# Patient Record
Sex: Male | Born: 1995 | Race: White | Hispanic: No | Marital: Married | Smoking: Never smoker
Health system: Southern US, Community
[De-identification: ages and names within clinical notes are randomized; demographics above are authoritative.]

## PROBLEM LIST (undated history)

## (undated) DIAGNOSIS — F329 Major depressive disorder, single episode, unspecified: Secondary | ICD-10-CM

## (undated) DIAGNOSIS — F32A Depression, unspecified: Secondary | ICD-10-CM

## (undated) DIAGNOSIS — G43909 Migraine, unspecified, not intractable, without status migrainosus: Secondary | ICD-10-CM

## (undated) HISTORY — DX: Depression, unspecified: F32.A

## (undated) HISTORY — DX: Major depressive disorder, single episode, unspecified: F32.9

## (undated) HISTORY — DX: Migraine, unspecified, not intractable, without status migrainosus: G43.909

---
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
## 2014-05-09 ENCOUNTER — Encounter: Payer: Self-pay | Admitting: Family Medicine

## 2014-05-09 ENCOUNTER — Ambulatory Visit (INDEPENDENT_AMBULATORY_CARE_PROVIDER_SITE_OTHER): Payer: BC Managed Care – PPO | Admitting: Family Medicine

## 2014-05-09 VITALS — BP 118/68 | HR 64 | Temp 98.9°F | Resp 12 | Ht 73.0 in | Wt 203.0 lb

## 2014-05-09 DIAGNOSIS — G43909 Migraine, unspecified, not intractable, without status migrainosus: Secondary | ICD-10-CM | POA: Insufficient documentation

## 2014-05-09 DIAGNOSIS — G43809 Other migraine, not intractable, without status migrainosus: Secondary | ICD-10-CM

## 2014-05-09 DIAGNOSIS — G43901 Migraine, unspecified, not intractable, with status migrainosus: Secondary | ICD-10-CM

## 2014-05-09 MED ORDER — PREDNISONE 20 MG PO TABS: ORAL_TABLET | ORAL | Status: DC

## 2014-05-09 MED ORDER — PROMETHAZINE HCL 12.5 MG PO TABS: 12.5000 mg | ORAL_TABLET | Freq: Four times a day (QID) | ORAL | Status: DC | PRN

## 2014-05-09 NOTE — Progress Notes (Signed)
   Subjective:    Patient ID: Jacob Kidd, male    DOB: 1996/01/31, 18 y.o.   MRN: [id number]  HPI Patient has a past medical history of migraines. He has seen neurology in the past. He has never been started on a daily preventative medication. 3 days ago he developed a diffuse headache. It is nonpulsatile in nature. He has associated photophobia and phonophobia.  He also has nausea. It feels like his typical migraine. He is tried ibuprofen with no relief. He denies any neurologic deficits. He is currently sitting in a dark room wearing sunglasses. Past Medical History  Diagnosis Date  . Migraines    No current outpatient prescriptions on file prior to visit.   No current facility-administered medications on file prior to visit.   No Known Allergies History   Social History  . Marital Status: Single    Spouse Name: N/A    Number of Children: N/A  . Years of Education: N/A   Occupational History  . Not on file.   Social History Main Topics  . Smoking status: Never Smoker   . Smokeless tobacco: Current User  . Alcohol Use: No  . Drug Use: No  . Sexual Activity: Not on file   Other Topics Concern  . Not on file   Social History Narrative  . No narrative on file      Review of Systems  All other systems reviewed and are negative.      Objective:   Physical Exam  Vitals reviewed. Constitutional: He is oriented to person, place, and time. He appears well-developed and well-nourished. No distress.  Eyes: Conjunctivae and EOM are normal. Pupils are equal, round, and reactive to light.  Cardiovascular: Normal rate, regular rhythm and normal heart sounds.   Pulmonary/Chest: Effort normal and breath sounds normal.  Neurological: He is alert and oriented to person, place, and time. He has normal reflexes. He displays normal reflexes. No cranial nerve deficit. He exhibits normal muscle tone. Coordination normal.  Skin: He is not diaphoretic.          Assessment &  Plan:  1. Status migrainosus Begin prednisone taper pack for status migrainosus. Also use Phenergan 12.5 mg by mouth every 6 hours when necessary headache or nausea. After resolution of this episode, consider beginning propranolol or Topamax as a daily preventative. - predniSONE (DELTASONE) 20 MG tablet; 3 tabs poqday 1-2, 2 tabs poqday 3-4, 1 tab poqday 5-6  Dispense: 12 tablet; Refill: 0 - promethazine (PHENERGAN) 12.5 MG tablet; Take 1 tablet (12.5 mg total) by mouth every 6 (six) hours as needed for nausea or vomiting.  Dispense: 30 tablet; Refill: 0

## 2015-01-16 ENCOUNTER — Ambulatory Visit (INDEPENDENT_AMBULATORY_CARE_PROVIDER_SITE_OTHER): Payer: 59 | Admitting: Physician Assistant

## 2015-01-16 ENCOUNTER — Encounter: Payer: Self-pay | Admitting: Physician Assistant

## 2015-01-16 ENCOUNTER — Telehealth: Payer: Self-pay | Admitting: Family Medicine

## 2015-01-16 ENCOUNTER — Ambulatory Visit
Admission: RE | Admit: 2015-01-16 | Discharge: 2015-01-16 | Disposition: A | Discharge status: Home / Self Care | Payer: 59 | Source: Ambulatory Visit | Attending: Physician Assistant | Admitting: Physician Assistant

## 2015-01-16 VITALS — BP 126/84 | HR 84 | Temp 98.9°F | Resp 18 | Wt 207.0 lb

## 2015-01-16 DIAGNOSIS — R202 Paresthesia of skin: Secondary | ICD-10-CM

## 2015-01-16 DIAGNOSIS — R29898 Other symptoms and signs involving the musculoskeletal system: Secondary | ICD-10-CM

## 2015-01-16 DIAGNOSIS — M542 Cervicalgia: Secondary | ICD-10-CM
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
## 2015-01-16 NOTE — Telephone Encounter (Signed)
Pt made aware of normal basic neck films.. Order for MRI placed

## 2015-01-16 NOTE — Telephone Encounter (Signed)
-----   Message from Dorena BodoMary B Dixon, PA-C sent at 01/16/2015 12:35 PM EST ----- Tell patient that x-ray normal. Tell him that we will schedule him for further test once we get preapproved with his insurance and get it scheduled. He has been having numbness tingling down his right arm for 2 years. Weakness in the arm.  Needs MRI cervical spine. Place order for MRI cervical spine.

## 2015-01-17 ENCOUNTER — Telehealth: Payer: Self-pay | Admitting: Family Medicine

## 2015-01-17 NOTE — Telephone Encounter (Signed)
Patients wife calling to say that his arm is worse today  Please call    [phone]

## 2015-01-17 NOTE — Telephone Encounter (Signed)
Pt called back.  Told MRI denied by insurance.  We have ordered ortho referral to assess further.

## 2015-01-20 ENCOUNTER — Emergency Department (HOSPITAL_COMMUNITY): Payer: 59

## 2015-01-20 ENCOUNTER — Emergency Department (HOSPITAL_COMMUNITY)
Admission: EM | Admit: 2015-01-20 | Discharge: 2015-01-20 | Disposition: A | Discharge status: Home / Self Care | Payer: 59 | Attending: Emergency Medicine | Admitting: Emergency Medicine

## 2015-01-20 ENCOUNTER — Encounter (HOSPITAL_COMMUNITY): Payer: Self-pay | Admitting: Emergency Medicine

## 2015-01-20 ENCOUNTER — Other Ambulatory Visit: Payer: Self-pay | Admitting: Family Medicine

## 2015-01-20 DIAGNOSIS — R202 Paresthesia of skin: Secondary | ICD-10-CM

## 2015-01-20 DIAGNOSIS — G43909 Migraine, unspecified, not intractable, without status migrainosus: Secondary | ICD-10-CM | POA: Insufficient documentation

## 2015-01-20 DIAGNOSIS — M79601 Pain in right arm: Secondary | ICD-10-CM | POA: Diagnosis present

## 2015-01-20 DIAGNOSIS — M75101 Unspecified rotator cuff tear or rupture of right shoulder, not specified as traumatic: Secondary | ICD-10-CM | POA: Diagnosis not present

## 2015-01-20 DIAGNOSIS — R200 Anesthesia of skin: Secondary | ICD-10-CM | POA: Diagnosis not present

## 2015-01-20 DIAGNOSIS — Z79899 Other long term (current) drug therapy: Secondary | ICD-10-CM | POA: Insufficient documentation

## 2015-01-20 DIAGNOSIS — M542 Cervicalgia: Secondary | ICD-10-CM

## 2015-01-20 DIAGNOSIS — Z791 Long term (current) use of non-steroidal anti-inflammatories (NSAID): Secondary | ICD-10-CM | POA: Insufficient documentation

## 2015-01-20 DIAGNOSIS — R29898 Other symptoms and signs involving the musculoskeletal system: Secondary | ICD-10-CM

## 2015-01-20 MED ORDER — DICLOFENAC SODIUM 50 MG PO TBEC
50.0000 mg | DELAYED_RELEASE_TABLET | Freq: Two times a day (BID) | ORAL | Status: DC
Start: 2015-01-20 — End: 2015-09-02

## 2015-01-20 MED ORDER — HYDROCODONE-ACETAMINOPHEN 5-325 MG PO TABS
1.0000 | ORAL_TABLET | Freq: Once | ORAL | Status: AC
  Administered 2015-01-20: 1 via ORAL

## 2015-01-20 MED ORDER — OXYCODONE-ACETAMINOPHEN 5-325 MG PO TABS
1.0000 | ORAL_TABLET | Freq: Once | ORAL | Status: DC
Start: 2015-01-20 — End: 2015-01-20

## 2015-01-20 MED ORDER — HYDROCODONE-ACETAMINOPHEN 5-325 MG PO TABS
ORAL_TABLET | ORAL | Status: AC
  Administered 2015-01-20: 1 via ORAL
  Filled 2015-01-20: qty 1

## 2015-01-20 MED ORDER — HYDROCODONE-ACETAMINOPHEN 5-325 MG PO TABS: 1.0000 | ORAL_TABLET | ORAL | Status: DC | PRN

## 2015-01-20 NOTE — ED Provider Notes (Signed)
CSN: [id number]     Arrival date & time 01/20/15  1414 History  This chart was scribed for Ivery QualeHobson Bryant, PA-C with Vanetta MuldersScott Zackowski, MD by Tonye RoyaltyJoshua Chen, ED Scribe. This patient was seen in room APFT22/APFT22 and the patient's care was started at 3:14 PM.    Chief Complaint  Patient presents with  . Arm Pain   Patient is a 19 y.o. male presenting with arm pain. The history is provided by the patient. No language interpreter was used.  Arm Pain This is a chronic problem. Episode onset: 2 years ago, worse past few days. The problem occurs daily. The problem has been gradually worsening. Pertinent negatives include no chest pain. The symptoms are aggravated by exertion (movement). Nothing relieves the symptoms. He has tried nothing for the symptoms.    HPI Comments: Jacob Kidd is a 19 y.o. male who presents to the Emergency Department complaining of right arm pain and numbness, worse in the past few days. Per records, he has been dealing with intermittent numbness and tingling to his right arm for the past few years. He had an x-ray of his c-spine at his PCP 4 days ago that was normal. He states he was scheduled to see orthopedist and get MRI, but this morning was told the plan was changed and he is instead to see physical therapy, though he states he does not have appointment scheduled yet. He states his arm has been numb and tingling all day today, has had a burning sensation to his shoulder and right neck, and is not able to hold his arm above his head for long. His work involved exertion using his hands and states he is unable to turn a wrench or do other things without pain.   Past Medical History  Diagnosis Date  . Migraines    History reviewed. No pertinent past surgical history. History reviewed. No pertinent family history. History  Substance Use Topics  . Smoking status: Never Smoker   . Smokeless tobacco: Current User  . Alcohol Use: No    Review of Systems  Cardiovascular:  Negative for chest pain.  Musculoskeletal:       Right arm pain  Neurological: Positive for weakness and numbness.  All other systems reviewed and are negative.     Allergies  Review of patient's allergies indicates no known allergies.  Home Medications   Prior to Admission medications   Medication Sig Start Date End Date Taking? Authorizing Provider  cyclobenzaprine (FLEXERIL) 10 MG tablet Take 10 mg by mouth 3 (three) times daily as needed for muscle spasms.   Yes Historical Provider, MD  diclofenac (VOLTAREN) 50 MG EC tablet Take 1 tablet (50 mg total) by mouth 2 (two) times daily. 01/20/15   Hope Orlene OchM Neese, NP  HYDROcodone-acetaminophen (NORCO/VICODIN) 5-325 MG per tablet Take 1 tablet by mouth every 4 (four) hours as needed. 01/20/15   Hope Orlene OchM Neese, NP   BP 115/79 mmHg  Pulse 114  Temp(Src) 99 F (37.2 C) (Oral)  Resp 18  Ht 6\' 1"  (1.854 m)  Wt 208 lb (94.348 kg)  BMI 27.45 kg/m2  SpO2 100% Physical Exam  Constitutional: He is oriented to person, place, and time. He appears well-developed and well-nourished.  HENT:  Head: Normocephalic and atraumatic.  Eyes: Conjunctivae are normal.  Neck: Normal range of motion. Neck supple.  Cardiovascular: Normal rate, regular rhythm and normal heart sounds.   No murmur heard. Pulses:      Radial pulses are 2+ on  the right side, and 2+ on the left side.  Pulmonary/Chest: Effort normal and breath sounds normal. No respiratory distress. He has no wheezes. He has no rales.  Musculoskeletal: Normal range of motion.  No atrophy of thenar eminence on right Capillary refill <2 seconds No defect of palmar arch on right Full ROM of right elbow, wrist, and fingers No deformity or tenderness of forearm brachial pulse is 2+ tightness and moderate tenderness of upper trapezius on right pain to palpation to the anterior shoulder on right  Pt noted to have weakness with testing of the Supraspinatus and Infraspinatus on the right. Soreness with  ROM of right shoulder No deformity, no dislocation  Neurological: He is alert and oriented to person, place, and time.  Grip is symmetrical   Skin: Skin is warm and dry.  Psychiatric: He has a normal mood and affect.  Nursing note and vitals reviewed.   ED Course  Procedures (including critical care time)  DIAGNOSTIC STUDIES: Oxygen Saturation is 100% on room air, normal by my interpretation.    COORDINATION OF CARE: 3:28 PM Discussed treatment plan with patient at beside, including consult with radiology. The patient agrees with the plan and has no further questions at this time.   Labs Review Labs Reviewed - No data to display  Imaging Review Dg Eye Foreign Body  01/20/2015   CLINICAL DATA:  Metal working/exposure; clearance prior to MRI  EXAM: ORBITS FOR FOREIGN BODY - 2 VIEW  COMPARISON:  None  FINDINGS: No orbital metallic foreign bodies.  Bones and sinuses unremarkable.  IMPRESSION: No orbital metallic foreign bodies.   Electronically Signed   By: Ulyses Southward M.D.   On: 01/20/2015 17:14   Mr Cervical Spine Wo Contrast  01/20/2015   CLINICAL DATA:  Neck pain and right arm numbness for 3 years, worse in the last 3 weeks.  EXAM: MRI CERVICAL SPINE WITHOUT CONTRAST  TECHNIQUE: Multiplanar, multisequence MR imaging of the cervical spine was performed. No intravenous contrast was administered.  COMPARISON:  Cervical spine radiographs 01/16/2015  FINDINGS: Images are variably degraded by motion artifact, greatest on the axial T2 spin echo images.  There is unchanged straightening of the normal cervical lordosis. There is no significant listhesis. Vertebral body heights and intervertebral disc space heights are preserved. Very mild multilevel disc desiccation is present. Vertebral bone marrow signal is within normal limits. Craniocervical junction is unremarkable. Moderate right maxillary sinus mucosal thickening is partially visualized. Cervical spinal cord is normal in caliber and signal.  Paraspinal soft tissues are unremarkable.  C2-3:  Minimal left uncovertebral spurring without stenosis.  C3-4: Right uncovertebral spurring results in mild neural foraminal stenosis. No spinal stenosis.  C4-5:  Negative.  C5-6:  Minimal disc bulging without stenosis.  C6-7:  Negative.  C7-T1:  Negative.  IMPRESSION: Mild right neural foraminal stenosis at C3-4 due to uncovertebral spurring. No spinal stenosis.   Electronically Signed   By: Sebastian Ache   On: 01/20/2015 18:40    MDM  Pt has hx of numbness of the right arm, but recently he can not raise the arm, or keep it raised for any extended period of time. The pain and numbness radiates from the neck to the right hand. MRI ordered.   Pt's care to be continued by [facility], NP - 1801pm  20 y.o. male with right shoulder pain x years but worse over the past few days. MRI reviewed by Dr. Deretha Emory and he discussed findings and plan of care with  the patient. Patient voices understanding and agrees with plan. Patient to follow up with his PCP and then PT and ortho referral. Sling applied, ice and rest. Pain medication and NSAIDS. Stable for d/c without vascular compromise.  Final diagnoses:  Rotator cuff syndrome, right    I personally performed the services described in this documentation, which was scribed in my presence. The recorded information has been reviewed and is accurate.   [facility] Orlene Och, NP 01/20/15 2200

## 2015-01-20 NOTE — ED Notes (Signed)
Dr Zackowski in to see pt.   

## 2015-01-20 NOTE — ED Provider Notes (Signed)
Medical screening examination/treatment/procedure(s) were conducted as a shared visit with non-physician practitioner(s) and myself.  I personally evaluated the patient during the encounter.   EKG Interpretation None      Patient discussed with me by [place]. I saw the patient. Patient's MRI results as listed below. Do not think they are directly related to his symptoms. Patient's symptoms seem to be more consistent with a right rotator cuff type injury. The patient's primary care doctor is referring him to physical therapy and orthopedics. Patient has had some intermittent numbness of the right hand all fingers not necessarily in a nerve root distribution. Would recommend symptomatic treatment and follow-up with orthopedics.  Patient with good range of motion of his fingers. States that all fingers are numb on the right hand but that's subjective. Patient with difficulty raising his right arm above his head which would be consistent with rotator cuff injury because there is pain when he tries to do that.   No results found for this or any previous visit. Dg Eye Foreign Body  01/20/2015   CLINICAL DATA:  Metal working/exposure; clearance prior to MRI  EXAM: ORBITS FOR FOREIGN BODY - 2 VIEW  COMPARISON:  None  FINDINGS: No orbital metallic foreign bodies.  Bones and sinuses unremarkable.  IMPRESSION: No orbital metallic foreign bodies.   Electronically Signed   By: Ulyses SouthwardMark  Boles M.D.   On: 01/20/2015 17:14   Dg Cervical Spine Complete  01/16/2015   CLINICAL DATA:  Neck pain.  EXAM: CERVICAL SPINE  4+ VIEWS  COMPARISON:  None.  FINDINGS: There is no evidence of cervical spine fracture or prevertebral soft tissue swelling. Alignment is normal. No other significant bone abnormalities are identified.  IMPRESSION: No acute abnormality.   Electronically Signed   By: Maisie Fushomas  Register   On: 01/16/2015 12:30   Mr Cervical Spine Wo Contrast  01/20/2015   CLINICAL DATA:  Neck pain and right arm numbness for 3  years, worse in the last 3 weeks.  EXAM: MRI CERVICAL SPINE WITHOUT CONTRAST  TECHNIQUE: Multiplanar, multisequence MR imaging of the cervical spine was performed. No intravenous contrast was administered.  COMPARISON:  Cervical spine radiographs 01/16/2015  FINDINGS: Images are variably degraded by motion artifact, greatest on the axial T2 spin echo images.  There is unchanged straightening of the normal cervical lordosis. There is no significant listhesis. Vertebral body heights and intervertebral disc space heights are preserved. Very mild multilevel disc desiccation is present. Vertebral bone marrow signal is within normal limits. Craniocervical junction is unremarkable. Moderate right maxillary sinus mucosal thickening is partially visualized. Cervical spinal cord is normal in caliber and signal. Paraspinal soft tissues are unremarkable.  C2-3:  Minimal left uncovertebral spurring without stenosis.  C3-4: Right uncovertebral spurring results in mild neural foraminal stenosis. No spinal stenosis.  C4-5:  Negative.  C5-6:  Minimal disc bulging without stenosis.  C6-7:  Negative.  C7-T1:  Negative.  IMPRESSION: Mild right neural foraminal stenosis at C3-4 due to uncovertebral spurring. No spinal stenosis.   Electronically Signed   By: Sebastian AcheAllen  Grady   On: 01/20/2015 18:40      Vanetta MuldersScott Omarius Grantham, MD 01/20/15 [phone]

## 2015-01-20 NOTE — ED Notes (Signed)
Pt having shaking of upper ext, Has same pain in rt arm,  Alert, skin warm.

## 2015-01-20 NOTE — ED Notes (Signed)
Pt states that he has been having intermittent numbness and tingling of his right hand for a few years now with pain on right side of neck at times.

## 2015-01-20 NOTE — ED Notes (Signed)
Rt arm pain/numbness for 2 years. Seen by MD 4 days ago and had x-ray done. Advised to have PT and  See ortho, today increase in discomfort and  Came to Ed  Good radial pulse , motion .

## 2015-01-21 ENCOUNTER — Telehealth: Payer: Self-pay | Admitting: *Deleted

## 2015-01-21 DIAGNOSIS — M75100 Unspecified rotator cuff tear or rupture of unspecified shoulder, not specified as traumatic: Secondary | ICD-10-CM

## 2015-01-21 NOTE — Telephone Encounter (Signed)
[facility] to Dr. Pati GalloJames Kramer, MD orthopedic with referral number [id number]   Type of referral: consult and treat  Start Date: 01/21/15-end date 07/22/15  Number of visits:6  Specialist address: [address] [place], [place]  Medical [facility] name[facility]  Dx code: M75.100-Unspecified rotator cuff tear or rupture of unspecified shoulder  [facility] is aware of referral number was given verbally over phone

## 2015-01-21 NOTE — Telephone Encounter (Signed)
Received a call from Pt's wife stating that he went to ED on 01/20/15 with arm pain and dx him with Rotator Cuff syndrome, she says that he can not do Physical Therapy and [facility] ED suggestion as well. I placed referral to Orthopedic to expedite his referral for dx of rotator cuff syndrome.

## 2015-01-21 NOTE — Telephone Encounter (Signed)
Agree. Approve.

## 2015-01-22 ENCOUNTER — Telehealth: Payer: Self-pay | Admitting: Family Medicine

## 2015-01-22 NOTE — Telephone Encounter (Signed)
Contacted Beth back and gave her referral number for pt to be seen today.

## 2015-01-22 NOTE — Telephone Encounter (Signed)
[facility] calling to see if compass referral done for this patient to see dr Farris Haskramer today  Please call her at [phone]

## 2015-09-02 ENCOUNTER — Encounter: Payer: Self-pay | Admitting: Family Medicine

## 2015-09-02 ENCOUNTER — Ambulatory Visit (INDEPENDENT_AMBULATORY_CARE_PROVIDER_SITE_OTHER): Payer: 59 | Admitting: Family Medicine

## 2015-09-02 VITALS — BP 122/76 | HR 86 | Temp 98.9°F | Resp 18 | Ht 73.0 in | Wt 207.0 lb

## 2015-09-02 DIAGNOSIS — F322 Major depressive disorder, single episode, severe without psychotic features: Secondary | ICD-10-CM | POA: Diagnosis not present

## 2015-09-02 MED ORDER — VENLAFAXINE HCL ER 75 MG PO CP24: 150.0000 mg | ORAL_CAPSULE | Freq: Every day | ORAL | Status: DC

## 2015-09-02 NOTE — Progress Notes (Signed)
   Subjective:    Patient ID: Jacob Kidd, male    DOB: 12-10-1995, 19 y.o.   MRN: [id number]  HPI  Patient reports severe depression. Symptoms began approximately 5 years ago when his mother passed away. He has been battling depression ever since. He reports severe depression, anhedonia, poor energy, poor concentration, insomnia. He has a difficult time sleeping because his mind ruminates over daily events. He has to make himself get out of bed in the morning to go to work. He hates to leave the house. He has excessive feelings of guilt regarding his family. He denies a suicidal plan although he has thought about suicide. He does not keep any guns in the home for this reason. He states that he would never kill himself because he has a 78-year-old son that he doesn't want to abandon. However the depression has become so serious that his wife made the appointment today to get him help. He is also scheduled to talk with his pastor later this afternoon for counseling. Past Medical History  Diagnosis Date  . Migraines   . Depression    No past surgical history on file. No current outpatient prescriptions on file prior to visit.   No current facility-administered medications on file prior to visit.   No Known Allergies Social History   Social History  . Marital Status: Married    Spouse Name: N/A  . Number of Children: N/A  . Years of Education: N/A   Occupational History  . Not on file.   Social History Main Topics  . Smoking status: Never Smoker   . Smokeless tobacco: Current User  . Alcohol Use: No  . Drug Use: No  . Sexual Activity: Not on file   Other Topics Concern  . Not on file   Social History Narrative     Review of Systems  All other systems reviewed and are negative.      Objective:   Physical Exam  Cardiovascular: Normal rate, regular rhythm and normal heart sounds.   Pulmonary/Chest: Effort normal and breath sounds normal.  Psychiatric: His speech is  normal and behavior is normal. Judgment and thought content normal. His mood appears not anxious. His affect is not angry, not blunt, not labile and not inappropriate. Thought content is not paranoid and not delusional. Cognition and memory are normal. He exhibits a depressed mood. He expresses no homicidal and no suicidal ideation. He expresses no suicidal plans and no homicidal plans.  Vitals reviewed.         Assessment & Plan:  Severe single current episode of major depressive disorder, without psychotic features (HCC) - Plan: venlafaxine XR (EFFEXOR XR) 75 MG 24 hr capsule  Begin Effexor XR 75 mg by mouth every morning. In 2 weeks increase to 150 mg by mouth every morning. Recheck here in 4 weeks or immediately if worse. I strongly encouraged the patient to seek counseling.

## 2015-09-04 ENCOUNTER — Telehealth: Payer: Self-pay | Admitting: Family Medicine

## 2015-09-04 NOTE — Telephone Encounter (Signed)
Pt's wife aware.

## 2015-09-04 NOTE — Telephone Encounter (Signed)
Usually it causes the opposite.  However, try dosing it at night and let me know if symptoms do not improve.

## 2015-09-04 NOTE — Telephone Encounter (Signed)
Patients wife calling to say that the med prescribed is making him very sleepy  [phone]

## 2015-10-03 ENCOUNTER — Ambulatory Visit: Payer: 59 | Admitting: Family Medicine

## 2015-12-12 ENCOUNTER — Encounter (HOSPITAL_COMMUNITY): Payer: Self-pay | Admitting: Emergency Medicine

## 2015-12-12 ENCOUNTER — Emergency Department (INDEPENDENT_AMBULATORY_CARE_PROVIDER_SITE_OTHER)
Admission: EM | Admit: 2015-12-12 | Discharge: 2015-12-12 | Disposition: A | Discharge status: Home / Self Care | Payer: Self-pay | Source: Home / Self Care | Attending: Family Medicine | Admitting: Family Medicine

## 2015-12-12 DIAGNOSIS — A084 Viral intestinal infection, unspecified: Secondary | ICD-10-CM

## 2015-12-12 NOTE — ED Notes (Signed)
C/o intermittent abd pain onset x2 weeks associated w/nausea, emesis, diarrhea, chills Taking imodium w/some relief.  Has not been to work x2 weeks due to sx.  A&O x4... No acute distress.

## 2015-12-12 NOTE — Discharge Instructions (Signed)
Viral Gastroenteritis Do not take any more Imodium. Make sure you drink plenty of fluids to include Pedialyte. Slowly advance diet as tolerated. For worsening, new symptoms or problems, persistent vomiting, fever, inability to have bowel movement seek medical attention promptly, may need to go to the emergency department. Viral gastroenteritis is also known as stomach flu. This condition affects the stomach and intestinal tract. It can cause sudden diarrhea and vomiting. The illness typically lasts 3 to 8 days. Most people develop an immune response that eventually gets rid of the virus. While this natural response develops, the virus can make you quite ill. CAUSES  Many different viruses can cause gastroenteritis, such as rotavirus or noroviruses. You can catch one of these viruses by consuming contaminated food or water. You may also catch a virus by sharing utensils or other personal items with an infected person or by touching a contaminated surface. SYMPTOMS  The most common symptoms are diarrhea and vomiting. These problems can cause a severe loss of body fluids (dehydration) and a body salt (electrolyte) imbalance. Other symptoms may include:  Fever.  Headache.  Fatigue.  Abdominal pain. DIAGNOSIS  Your caregiver can usually diagnose viral gastroenteritis based on your symptoms and a physical exam. A stool sample may also be taken to test for the presence of viruses or other infections. TREATMENT  This illness typically goes away on its own. Treatments are aimed at rehydration. The most serious cases of viral gastroenteritis involve vomiting so severely that you are not able to keep fluids down. In these cases, fluids must be given through an intravenous line (IV). HOME CARE INSTRUCTIONS   Drink enough fluids to keep your urine clear or pale yellow. Drink small amounts of fluids frequently and increase the amounts as tolerated.  Ask your caregiver for specific rehydration  instructions.  Avoid:  Foods high in sugar.  Alcohol.  Carbonated drinks.  Tobacco.  Juice.  Caffeine drinks.  Extremely hot or cold fluids.  Fatty, greasy foods.  Too much intake of anything at one time.  Dairy products until 24 to 48 hours after diarrhea stops.  You may consume probiotics. Probiotics are active cultures of beneficial bacteria. They may lessen the amount and number of diarrheal stools in adults. Probiotics can be found in yogurt with active cultures and in supplements.  Wash your hands well to avoid spreading the virus.  Only take over-the-counter or prescription medicines for pain, discomfort, or fever as directed by your caregiver. Do not give aspirin to children. Antidiarrheal medicines are not recommended.  Ask your caregiver if you should continue to take your regular prescribed and over-the-counter medicines.  Keep all follow-up appointments as directed by your caregiver. SEEK IMMEDIATE MEDICAL CARE IF:   You are unable to keep fluids down.  You do not urinate at least once every 6 to 8 hours.  You develop shortness of breath.  You notice blood in your stool or vomit. This may look like coffee grounds.  You have abdominal pain that increases or is concentrated in one small area (localized).  You have persistent vomiting or diarrhea.  You have a fever.  The patient is a child younger than 3 months, and he or she has a fever.  The patient is a child older than 3 months, and he or she has a fever and persistent symptoms.  The patient is a child older than 3 months, and he or she has a fever and symptoms suddenly get worse.  The patient is a  baby, and he or she has no tears when crying. MAKE SURE YOU:   Understand these instructions.  Will watch your condition.  Will get help right away if you are not doing well or get worse.   This information is not intended to replace advice given to you by your health care provider. Make sure you  discuss any questions you have with your health care provider.   Document Released: 11/15/2005 Document Revised: 02/07/2012 Document Reviewed: 09/01/2011 Elsevier Interactive Patient Education [employer]2016 [employer] Inc.

## 2015-12-12 NOTE — ED Provider Notes (Signed)
CSN: [id number]     Arrival date & time 12/12/15  1440 History   First MD Initiated Contact with Patient 12/12/15 1553     Chief Complaint  Patient presents with  . Abdominal Pain   (Consider location/radiation/quality/duration/timing/severity/associated sxs/prior Treatment) HPI Comments: 20 year old male complaining of abdominal cramping, diarrhea and vomiting for 2 weeks. He states his vomiting is better. It only occurs after attempting to eat. The first week of the diarrhea he started taking Imodium right ear 2 every 4 hours until he stopped his diarrhea. Unfortunately he now has decreased bowel movements and having abdominal cramping. Denies fever. He did have an episode of vomiting yesterday and once early this morning at 2 AM. He is able to drink fluids without vomiting.   Past Medical History  Diagnosis Date  . Migraines   . Depression    History reviewed. No pertinent past surgical history. No family history on file. Social History  Substance Use Topics  . Smoking status: Never Smoker   . Smokeless tobacco: Current User  . Alcohol Use: No    Review of Systems  Constitutional: Positive for activity change and appetite change. Negative for fever.  HENT: Negative.   Respiratory: Negative.   Cardiovascular: Negative.   Gastrointestinal: Positive for nausea, vomiting, abdominal pain and diarrhea. Negative for blood in stool.  Genitourinary: Negative.   Neurological: Negative.     Allergies  Review of patient's allergies indicates no known allergies.  Home Medications   Prior to Admission medications   Medication Sig Start Date End Date Taking? Authorizing Provider  venlafaxine XR (EFFEXOR XR) 75 MG 24 hr capsule Take 2 capsules (150 mg total) by mouth daily with breakfast. 09/02/15   Donita Brooks, MD   Meds Ordered and Administered this Visit  Medications - No data to display  BP 119/81 mmHg  Pulse 68  Temp(Src) 97.6 F (36.4 C) (Oral)  Resp 16  SpO2 98% No  data found.   Physical Exam  Constitutional: He is oriented to person, place, and time. He appears well-developed and well-nourished. No distress.  Neck: Normal range of motion. Neck supple.  Cardiovascular: Normal rate, regular rhythm and normal heart sounds.   Pulmonary/Chest: No respiratory distress. He has no wheezes. He has no rales. He exhibits no tenderness.  Abdominal: Soft. Bowel sounds are normal. He exhibits no distension and no mass. There is no rebound and no guarding.  Minor tenderness to palpation along the lower mid and right abdomen.  Musculoskeletal: He exhibits no edema.  Neurological: He is alert and oriented to person, place, and time. He exhibits normal muscle tone.  Skin: Skin is warm and dry.  Psychiatric: He has a normal mood and affect.  Nursing note and vitals reviewed.   ED Course  Procedures (including critical care time)  Labs Review Labs Reviewed - No data to display  Imaging Review No results found.   Visual Acuity Review  Right Eye Distance:   Left Eye Distance:   Bilateral Distance:    Right Eye Near:   Left Eye Near:    Bilateral Near:         MDM   1. Viral gastroenteritis    Do not take any more Imodium. Make sure you drink plenty of fluids to include Pedialyte. Slowly advance diet as tolerated. For worsening, new symptoms or problems, persistent vomiting, fever, inability to have bowel movement seek medical attention promptly, may need to go to the emergency department.    Hayden Rasmussen,  NP 12/12/15 1651

## 2016-09-14 IMAGING — CR DG ORBITS FOR FOREIGN BODY
2 series · 2 of 2 positions shown · non-contrast
Comparison: None

CLINICAL DATA: Metal working/exposure; clearance prior to MRI

EXAM:
ORBITS FOR FOREIGN BODY - 2 VIEW

[view not recorded (1 of 2)]
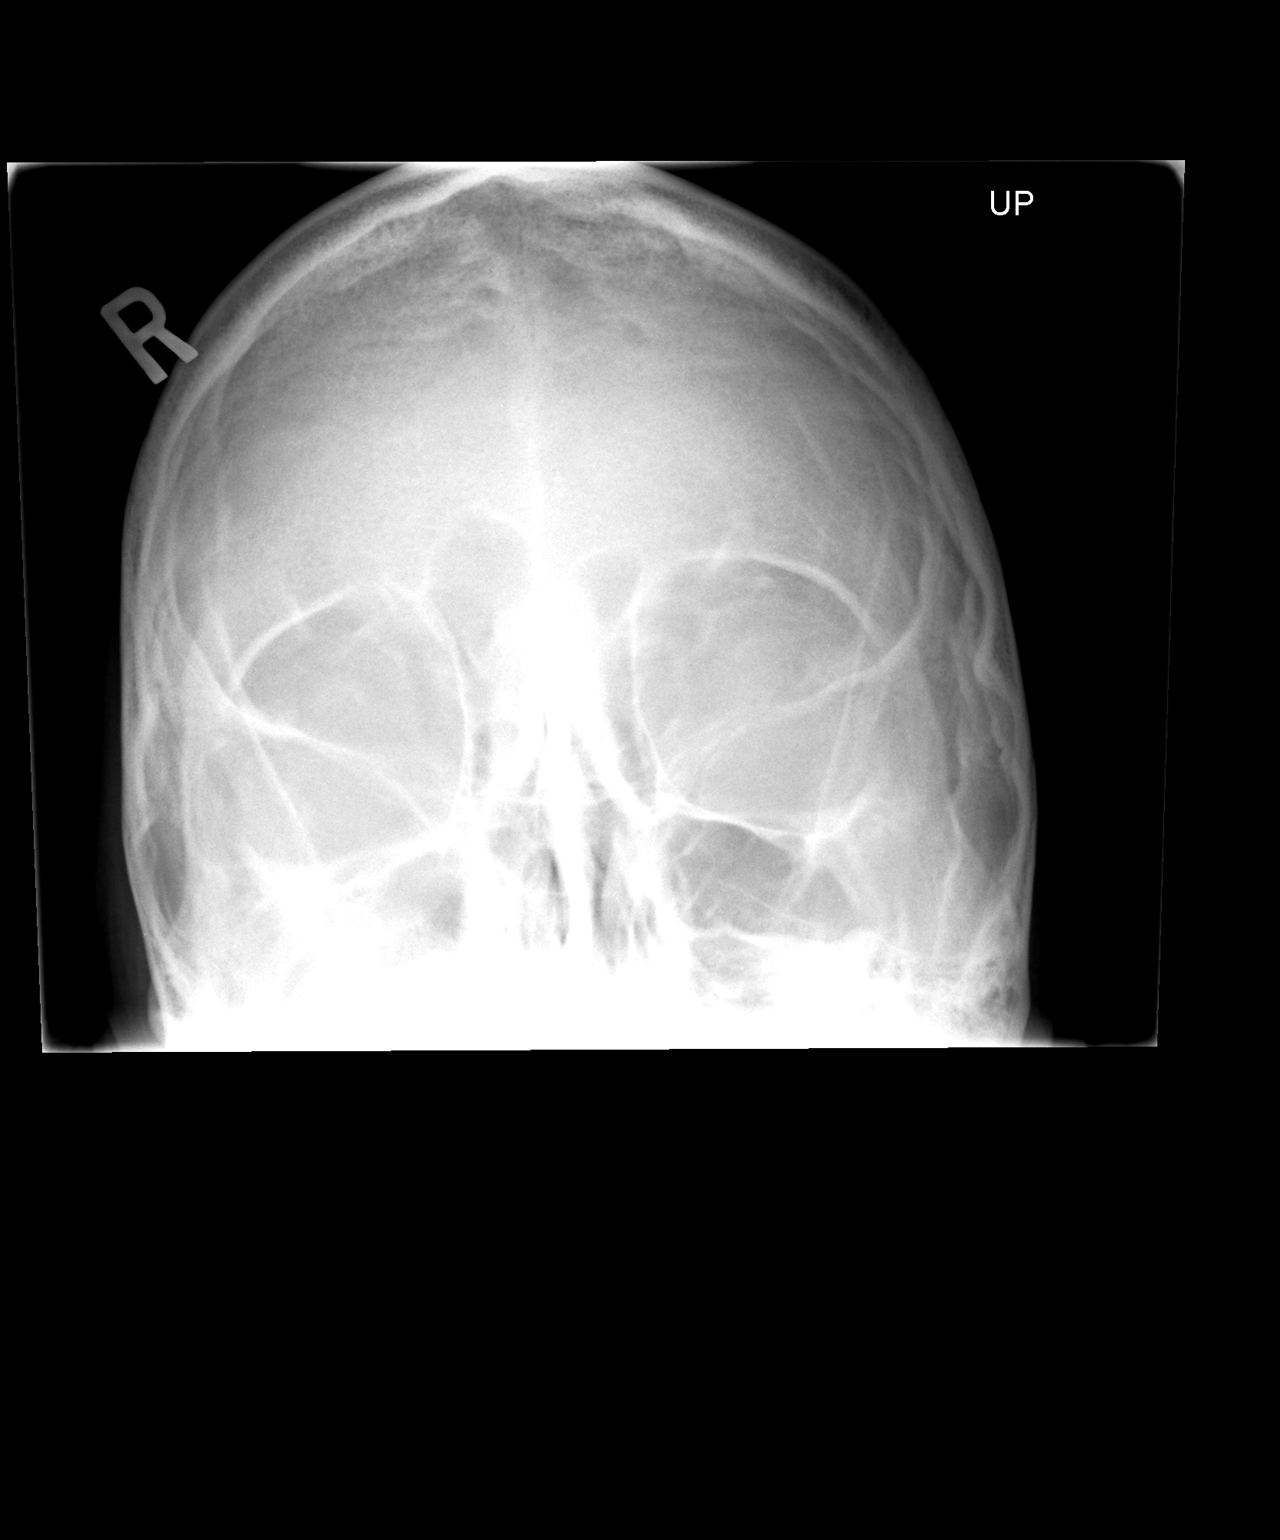

[view not recorded (2 of 2)]
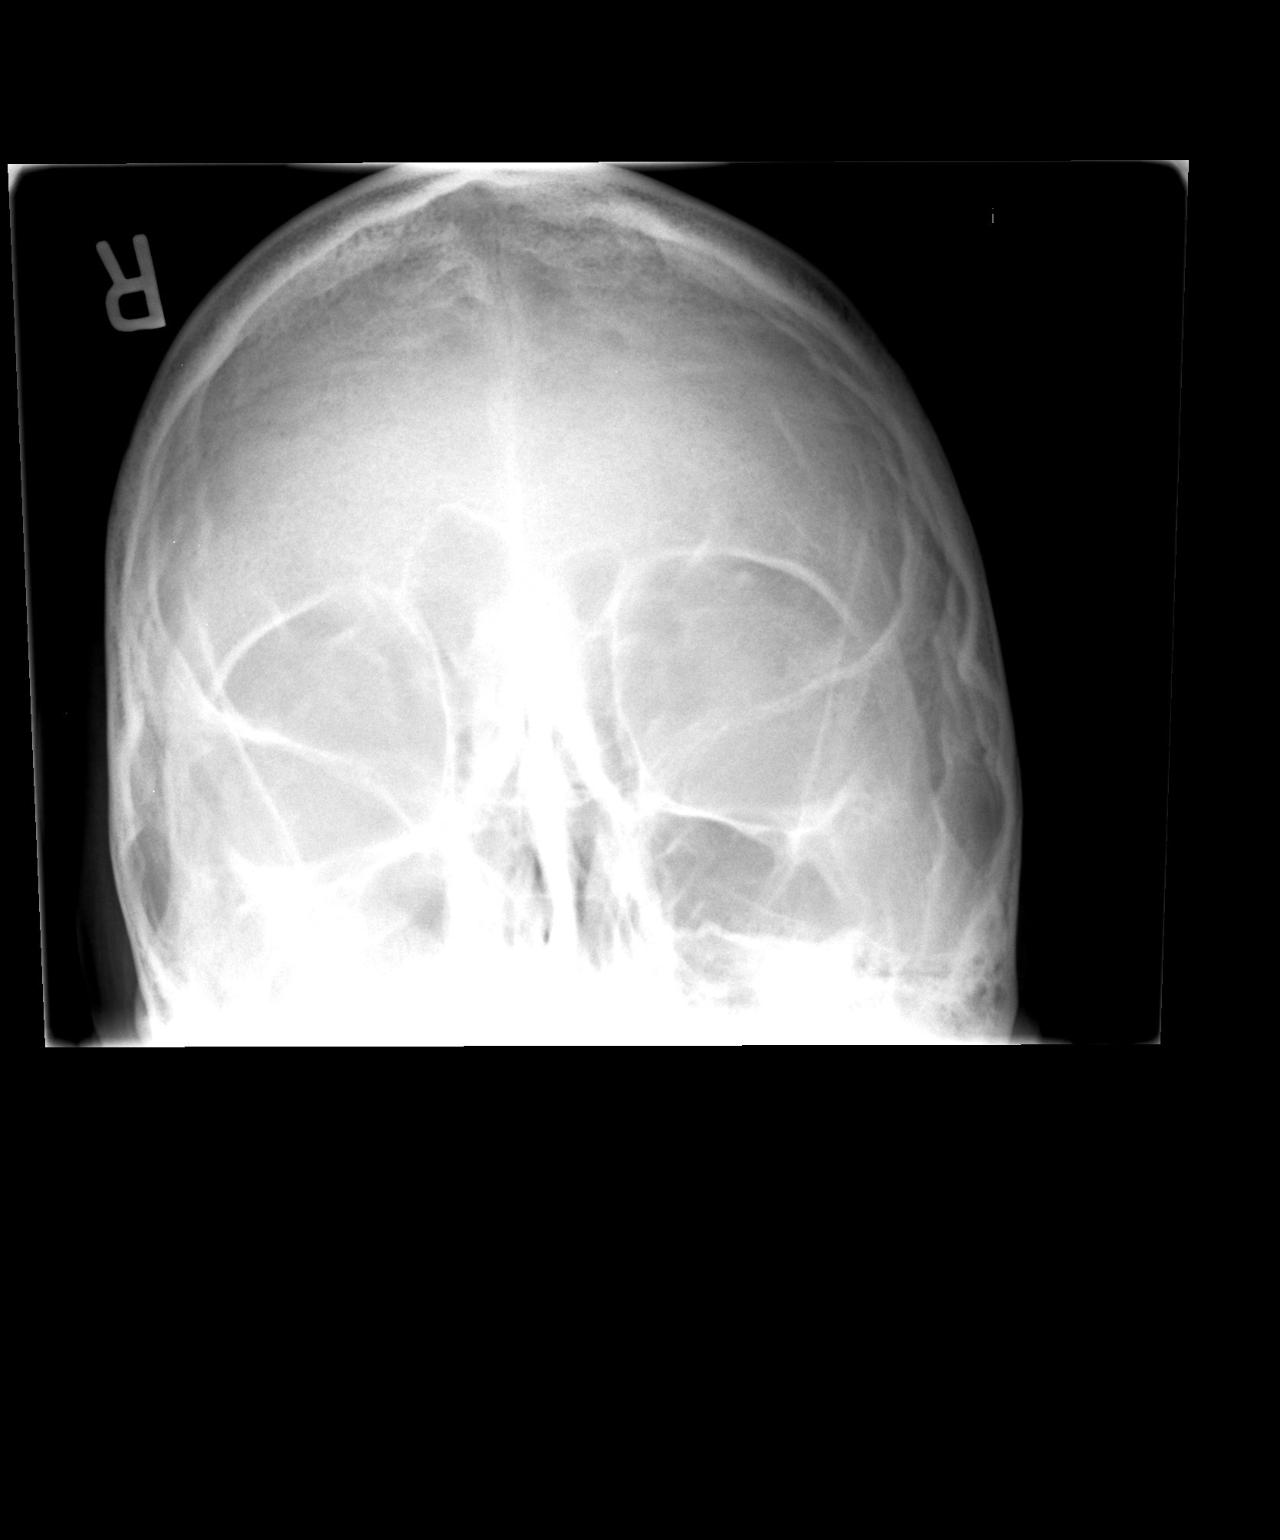

[2 of 2 positions shown; findings below may reference images not displayed]

FINDINGS: No orbital metallic foreign bodies.

Bones and sinuses unremarkable.
IMPRESSION: No orbital metallic foreign bodies.

## 2019-06-08 ENCOUNTER — Other Ambulatory Visit: Payer: Self-pay

## 2019-06-08 DIAGNOSIS — Z20822 Contact with and (suspected) exposure to covid-19: Secondary | ICD-10-CM

## 2019-06-14 LAB — NOVEL CORONAVIRUS, NAA: SARS-CoV-2, NAA: NOT DETECTED

## 2019-06-15 ENCOUNTER — Telehealth: Payer: Self-pay | Admitting: General Practice

## 2019-06-15 NOTE — Telephone Encounter (Signed)
General/Other - Results  Covid-19 Results given

## 2019-10-18 ENCOUNTER — Other Ambulatory Visit: Payer: Self-pay

## 2019-10-18 DIAGNOSIS — Z20822 Contact with and (suspected) exposure to covid-19: Secondary | ICD-10-CM

## 2019-10-20 ENCOUNTER — Telehealth: Payer: Self-pay

## 2019-10-20 NOTE — Telephone Encounter (Signed)
Pt's wife called for covid results. Advised results are not back.

## 2019-10-21 LAB — NOVEL CORONAVIRUS, NAA: SARS-CoV-2, NAA: NOT DETECTED

## 2020-03-06 ENCOUNTER — Other Ambulatory Visit: Payer: Self-pay

## 2020-03-06 ENCOUNTER — Ambulatory Visit: Payer: 59 | Admitting: Nurse Practitioner

## 2020-03-06 ENCOUNTER — Ambulatory Visit (INDEPENDENT_AMBULATORY_CARE_PROVIDER_SITE_OTHER): Payer: Self-pay | Admitting: Nurse Practitioner

## 2020-03-06 ENCOUNTER — Encounter: Payer: Self-pay | Admitting: Nurse Practitioner

## 2020-03-06 VITALS — BP 124/72 | HR 78 | Temp 97.6°F | Resp 18 | Ht 72.0 in | Wt 234.8 lb

## 2020-03-06 DIAGNOSIS — Z13228 Encounter for screening for other metabolic disorders: Secondary | ICD-10-CM

## 2020-03-06 DIAGNOSIS — R5383 Other fatigue: Secondary | ICD-10-CM

## 2020-03-06 DIAGNOSIS — Z6831 Body mass index (BMI) 31.0-31.9, adult: Secondary | ICD-10-CM

## 2020-03-06 DIAGNOSIS — L678 Other hair color and hair shaft abnormalities: Secondary | ICD-10-CM

## 2020-03-06 DIAGNOSIS — Z114 Encounter for screening for human immunodeficiency virus [HIV]: Secondary | ICD-10-CM

## 2020-03-06 DIAGNOSIS — Z1271 Encounter for screening for malignant neoplasm of testis: Secondary | ICD-10-CM

## 2020-03-06 DIAGNOSIS — Z13220 Encounter for screening for lipoid disorders: Secondary | ICD-10-CM

## 2020-03-06 DIAGNOSIS — Z23 Encounter for immunization: Secondary | ICD-10-CM

## 2020-03-06 DIAGNOSIS — Z0001 Encounter for general adult medical examination with abnormal findings: Secondary | ICD-10-CM

## 2020-03-06 DIAGNOSIS — Z7689 Persons encountering health services in other specified circumstances: Secondary | ICD-10-CM

## 2020-03-06 NOTE — Progress Notes (Signed)
New Patient Office Visit  Subjective:  Patient ID: Jacob Kidd, male    DOB: 05-31-1996  Age: 24 y.o. MRN: [id number]  CC:  Chief Complaint  Patient presents with  . Establish Care    NP    HPI Jacob Kidd is a 24 year old male presenting to clinic to establish care. He is accompanied by his step mother.  Testicular home exams monthly for testicular cancer screen Staying healthy with exercise and diet discussed with education al print out Tdap per pt is up to date Approved of HIV lab screening No cp/ct, gi,gu sxs, pain, edema, sob, or recent falls/injury. No depression or anxiety. No etoh/drug/tobacco abuse.   Past Medical History:  Diagnosis Date  . Depression   . Migraines     No past surgical history on file.  No family history on file.  Social History   Socioeconomic History  . Marital status: Married    Spouse name: Not on file  . Number of children: Not on file  . Years of education: Not on file  . Highest education level: Not on file  Occupational History  . Not on file  Tobacco Use  . Smoking status: Never Smoker  . Smokeless tobacco: Current User  Substance and Sexual Activity  . Alcohol use: No  . Drug use: No  . Sexual activity: Yes  Other Topics Concern  . Not on file  Social History Narrative  . Not on file   Social Determinants of Health   Financial Resource Strain:   . Difficulty of Paying Living Expenses:   Food Insecurity:   . Worried About Charity fundraiser in the Last Year:   . Arboriculturist in the Last Year:   Transportation Needs:   . Film/video editor (Medical):   Marland Kitchen Lack of Transportation (Non-Medical):   Physical Activity:   . Days of Exercise per Week:   . Minutes of Exercise per Session:   Stress:   . Feeling of Stress :   Social Connections:   . Frequency of Communication with Friends and Family:   . Frequency of Social Gatherings with Friends and Family:   . Attends Religious Services:   . Active  Member of Clubs or Organizations:   . Attends Archivist Meetings:   Marland Kitchen Marital Status:   Intimate Partner Violence:   . Fear of Current or Ex-Partner:   . Emotionally Abused:   Marland Kitchen Physically Abused:   . Sexually Abused:     ROS Review of Systems  All other systems reviewed and are negative.   Objective:   Today's Vitals: BP 124/72 (BP Location: Left Arm, Patient Position: Sitting, Cuff Size: Large)   Pulse 78   Temp 97.6 F (36.4 C) (Temporal)   Resp 18   Ht 6' (1.829 m)   Wt 234 lb 12.8 oz (106.5 kg)   SpO2 98%   BMI 31.84 kg/m   Physical Exam Vitals and nursing note reviewed.  Constitutional:      Appearance: Normal appearance. He is well-developed and well-groomed.  HENT:     Head: Normocephalic.     Right Ear: Hearing, tympanic membrane, ear canal and external ear normal.     Left Ear: Hearing, tympanic membrane, ear canal and external ear normal.     Nose: Nose normal.     Mouth/Throat:     Lips: Pink.     Mouth: Mucous membranes are moist.     Pharynx:  Oropharynx is clear.  Eyes:     General: Lids are normal. Lids are everted, no foreign bodies appreciated.     Extraocular Movements: Extraocular movements intact.     Conjunctiva/sclera: Conjunctivae normal.     Pupils: Pupils are equal, round, and reactive to light.  Neck:     Thyroid: No thyromegaly or thyroid tenderness.     Vascular: No carotid bruit or JVD.  Cardiovascular:     Rate and Rhythm: Normal rate and regular rhythm.     Pulses: Normal pulses.     Heart sounds: Normal heart sounds, S1 normal and S2 normal.  Pulmonary:     Effort: Pulmonary effort is normal.     Breath sounds: Normal breath sounds.  Chest:     Chest wall: No deformity.  Abdominal:     General: Abdomen is flat. Bowel sounds are normal. There is no abdominal bruit.     Palpations: Abdomen is soft. There is no hepatomegaly or splenomegaly.  Musculoskeletal:        General: Normal range of motion.     Cervical  back: Normal range of motion and neck supple.     Right lower leg: No edema.     Left lower leg: No edema.  Lymphadenopathy:     Cervical: No cervical adenopathy.  Skin:    General: Skin is warm and dry.     Capillary Refill: Capillary refill takes less than 2 seconds.  Neurological:     General: No focal deficit present.     Mental Status: He is alert and oriented to person, place, and time.  Psychiatric:        Attention and Perception: Attention normal.        Mood and Affect: Mood normal.        Speech: Speech normal.        Behavior: Behavior normal. Behavior is cooperative.        Thought Content: Thought content normal.        Cognition and Memory: Cognition normal.        Judgment: Judgment normal.     Assessment & Plan:   Problem List Items Addressed This Visit    None    Visit Diagnoses    Establishing care with new doctor, encounter for    -  Primary   Relevant Orders   COMPLETE METABOLIC PANEL WITH GFR   CBC with Differential/Platelet   Lipid panel   HIV Antibody (routine testing w rflx)   TSH   T4, Free   Screening for testicular cancer       Lipid screening       Relevant Orders   Lipid panel   Screening for metabolic disorder       Relevant Orders   COMPLETE METABOLIC PANEL WITH GFR   CBC with Differential/Platelet   Encounter for screening for HIV       Relevant Orders   HIV Antibody (routine testing w rflx)   Need for Tdap vaccination       Adult BMI 31.0-31.9 kg/sq m       Relevant Orders   COMPLETE METABOLIC PANEL WITH GFR   CBC with Differential/Platelet   Lipid panel   HIV Antibody (routine testing w rflx)   TSH   T4, Free   Brittle hair       Relevant Orders   COMPLETE METABOLIC PANEL WITH GFR   CBC with Differential/Platelet   Lipid panel   HIV Antibody (routine testing w  rflx)   TSH   T4, Free   Tiredness       Relevant Orders   COMPLETE METABOLIC PANEL WITH GFR   CBC with Differential/Platelet   Lipid panel   HIV Antibody  (routine testing w rflx)   TSH   T4, Free      Outpatient Encounter Medications as of 03/06/2020  Medication Sig  . [DISCONTINUED] venlafaxine XR (EFFEXOR XR) 75 MG 24 hr capsule Take 2 capsules (150 mg total) by mouth daily with breakfast.   No facility-administered encounter medications on file as of 03/06/2020.    Follow-up: Return if symptoms worsen or fail to improve, for and annually.   Elmore Guise, FNP

## 2020-03-07 LAB — COMPLETE METABOLIC PANEL WITH GFR
AG Ratio: 2.1 (calc) (ref 1.0–2.5)
ALT: 24 U/L (ref 9–46)
AST: 20 U/L (ref 10–40)
Albumin: 4.8 g/dL (ref 3.6–5.1)
Alkaline phosphatase (APISO): 75 U/L (ref 36–130)
BUN: 15 mg/dL (ref 7–25)
CO2: 28 mmol/L (ref 20–32)
Calcium: 9.3 mg/dL (ref 8.6–10.3)
Chloride: 104 mmol/L (ref 98–110)
Creat: 1.11 mg/dL (ref 0.60–1.35)
GFR, Est African American: 106 mL/min/{1.73_m2} (ref >=60)
GFR, Est Non African American: 91 mL/min/{1.73_m2} (ref >=60)
Globulin: 2.3 g/dL (calc) (ref 1.9–3.7)
Glucose, Bld: 88 mg/dL (ref 65–99)
Potassium: 4.3 mmol/L (ref 3.5–5.3)
Sodium: 142 mmol/L (ref 135–146)
Total Bilirubin: 0.4 mg/dL (ref 0.2–1.2)
Total Protein: 7.1 g/dL (ref 6.1–8.1)

## 2020-03-07 LAB — CBC WITH DIFFERENTIAL/PLATELET
Absolute Monocytes: 662 cells/uL (ref 200–950)
Basophils Absolute: 29 cells/uL (ref 0–200)
Basophils Relative: 0.3 %
Eosinophils Absolute: 86 cells/uL (ref 15–500)
Eosinophils Relative: 0.9 %
HCT: 48.2 % (ref 38.5–50.0)
Hemoglobin: 16.2 g/dL (ref 13.2–17.1)
Lymphs Abs: 2755 cells/uL (ref 850–3900)
MCH: 29.6 pg (ref 27.0–33.0)
MCHC: 33.6 g/dL (ref 32.0–36.0)
MCV: 88.1 fL (ref 80.0–100.0)
MPV: 10.8 fL (ref 7.5–12.5)
Monocytes Relative: 6.9 %
Neutro Abs: 6067 cells/uL (ref 1500–7800)
Neutrophils Relative %: 63.2 %
Platelets: 203 10*3/uL (ref 140–400)
RBC: 5.47 10*6/uL (ref 4.20–5.80)
RDW: 12.5 % (ref 11.0–15.0)
Total Lymphocyte: 28.7 %
WBC: 9.6 10*3/uL (ref 3.8–10.8)

## 2020-03-07 LAB — LIPID PANEL
Cholesterol: 166 mg/dL (ref <200)
HDL: 32 mg/dL — ABNORMAL LOW (ref >=40)
LDL Cholesterol (Calc): 100 mg/dL (calc) — ABNORMAL HIGH
Non-HDL Cholesterol (Calc): 134 mg/dL (calc) — ABNORMAL HIGH (ref <130)
Total CHOL/HDL Ratio: 5.2 (calc) — ABNORMAL HIGH (ref <5.0)
Triglycerides: 224 mg/dL — ABNORMAL HIGH (ref <150)

## 2020-03-07 LAB — HIV ANTIBODY (ROUTINE TESTING W REFLEX): HIV 1&2 Ab, 4th Generation: NONREACTIVE

## 2020-03-07 LAB — T4, FREE: Free T4: 1.4 ng/dL (ref 0.8–1.8)

## 2020-03-07 LAB — TSH: TSH: 1.24 mIU/L (ref 0.40–4.50)

## 2020-03-11 NOTE — Progress Notes (Signed)
Triglycerides elevated follow up lab 6 months make appt. To see me 1 wk after. Follow low fat and cholesterol diet and exercise 20 minutes 4-5 times per week.

## 2020-04-22 ENCOUNTER — Telehealth (INDEPENDENT_AMBULATORY_CARE_PROVIDER_SITE_OTHER): Payer: Self-pay | Admitting: Nurse Practitioner

## 2020-04-22 ENCOUNTER — Other Ambulatory Visit: Payer: Self-pay

## 2020-04-22 ENCOUNTER — Encounter: Payer: Self-pay | Admitting: Nurse Practitioner

## 2020-04-22 DIAGNOSIS — J988 Other specified respiratory disorders: Secondary | ICD-10-CM

## 2020-04-22 DIAGNOSIS — B9789 Other viral agents as the cause of diseases classified elsewhere: Secondary | ICD-10-CM

## 2020-04-22 NOTE — Patient Instructions (Signed)
Your symptoms are consistent with a viral respiratory illness. I recommend that you complete COVID testing and quarantine until negative results.  Your treatment plan includes resting and drinking plenty of fluids, you may take over the counter medicine for pain and fever, congestion, sore throat.  Salt water gargeles three times a day for sore throat (1 teaspoon of salt in 1 cup of warm water)

## 2020-04-22 NOTE — Progress Notes (Signed)
Virtual Visit via Video Note  I connected with Jacob Kidd on 04/22/20 at  8:15 AM EDT by a video enabled telemedicine application and verified that I am speaking with the correct person using two identifiers.   I discussed the limitations of evaluation and management by telemedicine and the availability of in person appointments. The patient expressed understanding and agreed to proceed.  History of Present Illness: Patient is a 24 year old male presenting for a video visit for symptoms of sore throat, cough, ear pressure, nasal congestion and clear drainage, chills unknown if have fever not taken, and lower back ache. Started 1 AM Sunday.  He has tried tx of tylenol cold and flu, Nyquil with minimal decrease in symptoms.  He has tried no other treatments.  Denied sick contact.  At that he Works outside the home. Has not had COVID vaccine.  He denies loss of taste, loss of smell, headache.  No GU/GI symptoms, shortness of breath, general body aches.   Past Medical History:  Diagnosis Date  . Depression   . Migraines    No past surgical history on file. No current outpatient medications on file prior to visit.   No current facility-administered medications on file prior to visit.   No Known Allergies Social History   Socioeconomic History  . Marital status: Married    Spouse name: Not on file  . Number of children: Not on file  . Years of education: Not on file  . Highest education level: Not on file  Occupational History  . Not on file  Tobacco Use  . Smoking status: Never Smoker  . Smokeless tobacco: Current User  Substance and Sexual Activity  . Alcohol use: No  . Drug use: No  . Sexual activity: Yes  Other Topics Concern  . Not on file  Social History Narrative  . Not on file   Social Determinants of Health   Financial Resource Strain:   . Difficulty of Paying Living Expenses:   Food Insecurity:   . Worried About Charity fundraiser in the Last Year:   . Arts development officer in the Last Year:   Transportation Needs:   . Film/video editor (Medical):   Marland Kitchen Lack of Transportation (Non-Medical):   Physical Activity:   . Days of Exercise per Week:   . Minutes of Exercise per Session:   Stress:   . Feeling of Stress :   Social Connections:   . Frequency of Communication with Friends and Family:   . Frequency of Social Gatherings with Friends and Family:   . Attends Religious Services:   . Active Member of Clubs or Organizations:   . Attends Archivist Meetings:   Marland Kitchen Marital Status:   Intimate Partner Violence:   . Fear of Current or Ex-Partner:   . Emotionally Abused:   Marland Kitchen Physically Abused:   . Sexually Abused:    Breast Cancer-relatedfamily history is not on file.  There is no immunization history on file for this patient.  Observations/Objective: Able to speak full sentences, no harsh cough, a/o x 3, does not appear ill or toxic, in car driving.  Assessment and Plan: Viral respiratory illness  Your symptoms are consistent with a viral respiratory illness. I recommend that you complete COVID testing and quarantine until negative results.  Your treatment plan includes resting and drinking plenty of fluids, you may take over the counter medicine for pain and fever, congestion, sore throat.  Salt water gargeles three  times a day for sore throat (1 teaspoon of salt in 1 cup of warm water)  Follow Up Instructions:    I discussed the assessment and treatment plan with the patient. The patient was provided an opportunity to ask questions and all were answered. The patient agreed with the plan and demonstrated an understanding of the instructions.   The patient was advised to call back or seek an in-person evaluation if the symptoms worsen or if the condition fails to improve as anticipated.  I provided 15 minutes of non-face-to-face time during this encounter. 8:30  Elmore Guise, FNP

## 2020-07-28 ENCOUNTER — Ambulatory Visit: Payer: Self-pay | Admitting: Nurse Practitioner

## 2020-07-30 ENCOUNTER — Other Ambulatory Visit: Payer: Self-pay

## 2020-07-30 ENCOUNTER — Ambulatory Visit: Payer: Self-pay | Admitting: Nurse Practitioner

## 2020-07-30 VITALS — BP 120/80 | HR 79 | Temp 97.6°F | Resp 18 | Wt 241.2 lb

## 2020-07-30 DIAGNOSIS — F418 Other specified anxiety disorders: Secondary | ICD-10-CM

## 2020-07-30 MED ORDER — ALPRAZOLAM 0.25 MG PO TABS: 0.2500 mg | ORAL_TABLET | Freq: Two times a day (BID) | ORAL | 0 refills | Status: DC | PRN

## 2020-07-30 MED ORDER — PAROXETINE HCL 10 MG PO TABS: 10.0000 mg | ORAL_TABLET | Freq: Every day | ORAL | 0 refills | Status: DC

## 2020-07-30 NOTE — Progress Notes (Signed)
Established Patient Office Visit  Subjective:  Patient ID: Jacob Kidd, male    DOB: 05/06/1996  Age: 24 y.o. MRN: [id number]  CC:  Chief Complaint  Patient presents with  . Anxiety    going on for a while  . Depression    HPI Jacob Kidd is a 24 year old male presenting to the clinic for sxs of depression and anxiety not feeling motivated to leave the home in two weeks. All he feels like doing is laying around all day. 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 He denied feeling suicidal or having thoughts of harm to self or others. He would like to try medications. He reports he already has an appt with psychologist Friday for counseling.  Time spent discussing action plan to record on his phone his self action planning to call 911 for help for thoughts of harm to self or others or suicidal ideation. He contracted. Also he contracted to get 20 minutes of exercise 3-4 times per week. Medication long acting taking 4 weeks to take effect and prn short acting to not be refilled discussed to be used to get to the long acting therapeutic effects as it can be habit forming and he verbalized understanding.   He denied drug, alcohol use.   Past Medical History:  Diagnosis Date  . Depression   . Migraines     No past surgical history on file.  No family history on file.  Social History   Socioeconomic History  . Marital status: Married    Spouse name: Not on file  . Number of children: Not on file  . Years of education: Not on file  . Highest education level: Not on file  Occupational History  . Not on file  Tobacco Use  . Smoking status: Never Smoker  . Smokeless tobacco: Current User  Substance and Sexual Activity  . Alcohol use: No  . Drug use: No  . Sexual activity: Yes  Other Topics Concern  . Not on file  Social History Narrative  . Not on file   Social Determinants of Health   Financial Resource Strain:   . Difficulty of Paying Living  Expenses: Not on file  Food Insecurity:   . Worried About Programme researcher, broadcasting/film/video in the Last Year: Not on file  . Ran Out of Food in the Last Year: Not on file  Transportation Needs:   . Lack of Transportation (Medical): Not on file  . Lack of Transportation (Non-Medical): Not on file  Physical Activity:   . Days of Exercise per Week: Not on file  . Minutes of Exercise per Session: Not on file  Stress:   . Feeling of Stress : Not on file  Social Connections:   . Frequency of Communication with Friends and Family: Not on file  . Frequency of Social Gatherings with Friends and Family: Not on file  . Attends Religious Services: Not on file  . Active Member of Clubs or Organizations: Not on file  . Attends Banker Meetings: Not on file  . Marital Status: Not on file  Intimate Partner Violence:   . Fear of Current or Ex-Partner: Not on file  . Emotionally Abused: Not on file  . Physically Abused: Not on file  . Sexually Abused: Not on file    No outpatient medications prior to visit.   No facility-administered medications prior to visit.    No Known Allergies  ROS Review of Systems  All other systems reviewed and are negative.     Objective:    Physical Exam Vitals and nursing note reviewed.  Constitutional:      General: He is not in acute distress.    Appearance: Normal appearance.  HENT:     Head: Normocephalic and atraumatic.  Eyes:     Extraocular Movements: Extraocular movements intact.     Pupils: Pupils are equal, round, and reactive to light.  Cardiovascular:     Rate and Rhythm: Normal rate.  Pulmonary:     Effort: Pulmonary effort is normal.  Neurological:     General: No focal deficit present.     Mental Status: He is alert and oriented to person, place, and time.  Psychiatric:        Attention and Perception: Attention and perception normal.        Mood and Affect: Mood and affect normal.        Speech: Speech normal.        Behavior:  Behavior normal. Behavior is cooperative.        Thought Content: Thought content normal.        Cognition and Memory: Cognition normal.        Judgment: Judgment normal.     BP 120/80 (BP Location: Left Arm, Patient Position: Sitting, Cuff Size: Large)   Pulse 79   Temp 97.6 F (36.4 C) (Temporal)   Resp 18   Wt 241 lb 3.2 oz (109.4 kg)   SpO2 98%   BMI 32.71 kg/m  Wt Readings from Last 3 Encounters:  07/30/20 241 lb 3.2 oz (109.4 kg)  03/06/20 234 lb 12.8 oz (106.5 kg)  09/02/15 207 lb (93.9 kg)     Health Maintenance Due  Topic Date Due  . Hepatitis C Screening  Never done  . COVID-19 Vaccine (1) Never done  . TETANUS/TDAP  Never done  . INFLUENZA VACCINE  Never done    There are no preventive care reminders to display for this patient.  Lab Results  Component Value Date   TSH 1.24 03/06/2020   Lab Results  Component Value Date   WBC 9.6 03/06/2020   HGB 16.2 03/06/2020   HCT 48.2 03/06/2020   MCV 88.1 03/06/2020   PLT 203 03/06/2020   Lab Results  Component Value Date   NA 142 03/06/2020   K 4.3 03/06/2020   CO2 28 03/06/2020   GLUCOSE 88 03/06/2020   BUN 15 03/06/2020   CREATININE 1.11 03/06/2020   BILITOT 0.4 03/06/2020   AST 20 03/06/2020   ALT 24 03/06/2020   PROT 7.1 03/06/2020   CALCIUM 9.3 03/06/2020   Lab Results  Component Value Date   CHOL 166 03/06/2020   Lab Results  Component Value Date   HDL 32 (L) 03/06/2020   Lab Results  Component Value Date   LDLCALC 100 (H) 03/06/2020   Lab Results  Component Value Date   TRIG 224 (H) 03/06/2020   Lab Results  Component Value Date   CHOLHDL 5.2 (H) 03/06/2020   No results found for: HGBA1C    Assessment & Plan:   Problem List Items Addressed This Visit    None    Visit Diagnoses    Depression with anxiety    -  Primary   Relevant Medications   PARoxetine (PAXIL) 10 MG tablet   ALPRAZolam (XANAX) 0.25 MG tablet      Meds ordered this encounter  Medications  .  PARoxetine (PAXIL) 10 MG tablet    Sig: Take 1 tablet (10 mg total) by mouth daily.    Dispense:  30 tablet    Refill:  0  . ALPRAZolam (XANAX) 0.25 MG tablet    Sig: Take 1 tablet (0.25 mg total) by mouth 2 (two) times daily as needed for anxiety.    Dispense:  20 tablet    Refill:  0    Follow-up: Return in about 4 weeks (around 08/27/2020) for with [facility] or .    Elmore Guise, FNP

## 2023-12-07 ENCOUNTER — Encounter (HOSPITAL_BASED_OUTPATIENT_CLINIC_OR_DEPARTMENT_OTHER): Payer: Self-pay | Admitting: Family Medicine

## 2023-12-07 ENCOUNTER — Ambulatory Visit (INDEPENDENT_AMBULATORY_CARE_PROVIDER_SITE_OTHER): Payer: Self-pay | Admitting: Family Medicine

## 2023-12-07 VITALS — BP 130/88 | HR 78 | Ht 72.0 in | Wt 255.0 lb

## 2023-12-07 DIAGNOSIS — G43009 Migraine without aura, not intractable, without status migrainosus: Secondary | ICD-10-CM

## 2023-12-07 MED ORDER — SUMATRIPTAN SUCCINATE 50 MG PO TABS: 50.0000 mg | ORAL_TABLET | ORAL | 0 refills | Status: DC | PRN

## 2023-12-07 NOTE — Progress Notes (Signed)
 New Patient Office Visit  Subjective:   Jacob Kidd 06/26/1996 12/07/2023  Chief Complaint  Patient presents with   New Patient (Initial Visit)    Patient is here to get established with the practice. States that he has been having headaches more frequently and states he has even had a headache that has lasted 4 days now.    HPI: XAYNE BRUMBAUGH presents today to establish care at [facility]. Introduced to publishing rights manager role and practice setting.  All questions answered.   Last PCP: Camelia Ricker, [facility] Last annual physical: Several years ago-has not seen provider in approximately 3 years per chart review. Concerns: See below   HEADACHE: Onset: Lifelong. Pt presenting today due to concern of recurring headaches and current headache lasting for 4 days.  Pt reports hx of headaches and migraines in the past. Reports migraines in childhood. Does not take any medications , not sure if prescribed med for migraine in the past.  Patient denies history of hypertension.  No elevated blood pressure readings present upon chart review in the past. Location of headache: Diffuse  Duration: chronic Quality: Feels like someone hitting me with a hammer  Severity: moderate Frequency: constant Radiation: yes Time of day headache occurs: N/a  Alleviating factors: None Aggravating factors: Exertional activities Treatments attempted: Treatments attempted: Advil, Tylenol , Excedrin     Aura: no Nausea:  no Vomiting: no Photophobia:  no Phonophobia:  no Effect on social functioning:  Yes Confusion:  no Gait disturbance/ataxia:  no Behavioral changes:  no Fevers:  no  Headache status at time of visit: current headache   The following portions of the patient's history were reviewed and updated as appropriate: past medical history, past surgical history, family history, social history, allergies,  medications, and problem list.   Patient Active Problem List   Diagnosis Date Noted   Migraines    Past Medical History:  Diagnosis Date   Depression    Migraines    History reviewed. No pertinent surgical history. Family History  Problem Relation Age of Onset   Migraines Mother    Diabetes Paternal Grandmother    Diabetes Paternal Grandfather    Social History   Socioeconomic History   Marital status: Married    Spouse name: Not on file   Number of children: Not on file   Years of education: Not on file   Highest education level: Not on file  Occupational History   Not on file  Tobacco Use   Smoking status: Never   Smokeless tobacco: Current  Vaping Use   Vaping status: Never Used  Substance and Sexual Activity   Alcohol use: No   Drug use: No   Sexual activity: Yes  Other Topics Concern   Not on file  Social History Narrative   Not on file   Social Drivers of Health   Financial Resource Strain: Low Risk  (12/07/2023)   Overall Financial Resource Strain (CARDIA)    Difficulty of Paying Living Expenses: Not hard at all  Food Insecurity: No Food Insecurity (12/07/2023)   Hunger Vital Sign    Worried About Running Out of Food in the Last Year: Never true    Ran Out of Food in the Last Year: Never true  Transportation Needs: No Transportation Needs (12/07/2023)   PRAPARE - Administrator, Civil Service (Medical): No    Lack of Transportation (Non-Medical): No  Physical Activity:  Inactive (12/07/2023)   Exercise Vital Sign    Days of Exercise per Week: 0 days    Minutes of Exercise per Session: 0 min  Stress: Stress Concern Present (12/07/2023)   [employer] of Occupational Health - Occupational Stress Questionnaire    Feeling of Stress : To some extent  Social Connections: Moderately Isolated (12/07/2023)   Social Connection and Isolation Panel [NHANES]    Frequency of Communication with Friends and Family: More than three times a week    Frequency  of Social Gatherings with Friends and Family: Never    Attends Religious Services: Never    Database Administrator or Organizations: No    Attends Banker Meetings: Never    Marital Status: Married  Catering Manager Violence: Not At Risk (12/07/2023)   Humiliation, Afraid, Rape, and Kick questionnaire    Fear of Current or Ex-Partner: No    Emotionally Abused: No    Physically Abused: No    Sexually Abused: No   Outpatient Medications Prior to Visit  Medication Sig Dispense Refill   ibuprofen (ADVIL) 200 MG tablet Take 800 mg by mouth as needed for headache.     ALPRAZolam  (XANAX ) 0.25 MG tablet Take 1 tablet (0.25 mg total) by mouth 2 (two) times daily as needed for anxiety. 20 tablet 0   PARoxetine  (PAXIL ) 10 MG tablet Take 1 tablet (10 mg total) by mouth daily. 30 tablet 0   No facility-administered medications prior to visit.   No Known Allergies  ROS: A complete ROS was performed with pertinent positives/negatives noted in the HPI. The remainder of the ROS are negative.   Objective:   Today's Vitals   12/07/23 1404 12/07/23 1433  BP: (!) 139/98 130/88  Pulse: 78   SpO2: 98%   Weight: 255 lb (115.7 kg)   Height: 6' (1.829 m)     GENERAL: Well-appearing, in NAD. Well nourished.  SKIN: Pink, warm and dry. No rash, lesion, ulceration, or ecchymoses.  Face slightly flushed. Head: Normocephalic. NECK: Trachea midline. Full ROM w/o pain or tenderness. No lymphadenopathy.  EARS: Tympanic membranes are intact, translucent without bulging and without drainage. Appropriate landmarks visualized.  EYES: Conjunctiva clear without exudates. EOMI, PERRL, no drainage present.  NOSE: Septum midline w/o deformity. Nares patent, mucosa pink and non-inflamed w/o drainage. No sinus tenderness.  THROAT: Uvula midline. Oropharynx clear.  Mucous membranes pink and moist.  RESPIRATORY: Chest wall symmetrical. Respirations even and non-labored. Breath sounds clear to auscultation  bilaterally.  CARDIAC: S1, S2 present, regular rate and rhythm without murmur or gallops. Peripheral pulses 2+ bilaterally.  MSK: Muscle tone and strength appropriate for age.  EXTREMITIES: Without clubbing, cyanosis, or edema.  NEUROLOGIC: No motor or sensory deficits. Steady, even gait. C2-C12 intact.  PSYCH/MENTAL STATUS: Alert, oriented x 3. Cooperative, appropriate mood and affect.     Assessment & Plan:  1. Migraine without aura and without status migrainosus, not intractable (Primary) Possible migraine headache versus tension headache.  Will treat with sumatriptan  50 mg every 2 hours, max 200 mg in 24 hours.  Recommend patient rest, use Tylenol  or ibuprofen as needed, increase clear fluids.  If no improvement in 24 to 48 hours, reach out to PCP.  Blood pressure did improve upon recheck, will have patient return in the next 1 to 2 weeks for physical and blood pressure recheck.  Patient to reach out to office if new, worrisome, or unresolved symptoms arise or if no improvement in patient's condition. Patient verbalized  understanding and is agreeable to treatment plan. All questions answered to patient's satisfaction.    Return in about 2 weeks (around 12/21/2023) for [facility], BP/Headache Follow up .    Thersia Schuyler Stark, [place]

## 2023-12-07 NOTE — Patient Instructions (Addendum)
 Please keep an eye on your Blood pressure and keep a log of readings.

## 2023-12-14 ENCOUNTER — Ambulatory Visit (HOSPITAL_BASED_OUTPATIENT_CLINIC_OR_DEPARTMENT_OTHER): Payer: Self-pay | Admitting: Family Medicine

## 2023-12-14 NOTE — Telephone Encounter (Signed)
 Copied from CRM [id number]. Topic: Clinical - Medication Question >> Dec 14, 2023 12:24 PM Geneva B wrote: Reason for CRM: patient is calling in because the medication he was prescribed sumatriptan  is not working for him he is still having headaches and he wants to know what to do next please call pt [phone]   Chief Complaint: Migraine headache  Symptoms: Diffuse headache, intermittent neck stiffness "when headache gets really bad" Frequency: Intermittent, but has a headache every day Pertinent Negatives: Patient denies fever, sore throat Disposition: [] ED /[] Urgent Care (no appt availability in office) / [x] Appointment(In office/virtual)/ []  [facility]/ [] Home Care/ [] Refused Recommended Disposition /[] [facility]/ []  Follow-up with PCP Additional Notes: Patient reports that he was seen on 12/07/23 for migraine headaches and was prescribed Sumatriptan , which he has been taking without improvement of his headaches. Patient states that he has also been taking Ibuprofen without relief. Patient was told to contact his PCP if his symptoms did not improve with the medication. He denies any new or worsening symptoms. Patient has an appointment for follow-up on 12/27/23 but would like to know if there is anything else he can do for his headaches between now and that appointment.      Reason for Disposition  Headache is a chronic symptom (recurrent or ongoing AND present > 4 weeks)  Answer Assessment - Initial Assessment Questions 1. LOCATION: "Where does it hurt?"      All over 2. ONSET: "When did the headache start?" (Minutes, hours or days)      Headache every day 3. PATTERN: "Does the pain come and go, or has it been constant since it started?"     Comes and goes  4. SEVERITY: "How bad is the pain?" and "What does it keep you from doing?"  (e.g., Scale 1-10; mild, moderate, or severe)   - MILD (1-3): doesn't interfere with normal activities    - MODERATE (4-7): interferes  with normal activities or awakens from sleep    - SEVERE (8-10): excruciating pain, unable to do any normal activities        8/10 5. RECURRENT SYMPTOM: "Have you ever had headaches before?" If Yes, ask: "When was the last time?" and "What happened that time?"      Yes 6. CAUSE: "What do you think is causing the headache?"     Migraine 7. MIGRAINE: "Have you been diagnosed with migraine headaches?" If Yes, ask: "Is this headache similar?"      Yes, similar  8. HEAD INJURY: "Has there been any recent injury to the head?"      No 9. OTHER SYMPTOMS: "Do you have any other symptoms?" (fever, stiff neck, eye pain, sore throat, cold symptoms)     Intermittent neck stiffness "when my headache gets really bad"  Protocols used: Headache-A-AH

## 2023-12-15 ENCOUNTER — Ambulatory Visit (HOSPITAL_BASED_OUTPATIENT_CLINIC_OR_DEPARTMENT_OTHER): Payer: No Typology Code available for payment source | Admitting: *Deleted

## 2023-12-15 ENCOUNTER — Other Ambulatory Visit (HOSPITAL_BASED_OUTPATIENT_CLINIC_OR_DEPARTMENT_OTHER): Payer: Self-pay | Admitting: Family Medicine

## 2023-12-15 DIAGNOSIS — I10 Essential (primary) hypertension: Secondary | ICD-10-CM

## 2023-12-15 MED ORDER — LOSARTAN POTASSIUM 25 MG PO TABS: 25.0000 mg | ORAL_TABLET | Freq: Every day | ORAL | 3 refills | Status: DC

## 2023-12-15 NOTE — Progress Notes (Signed)
Patient is in office today for a nurse visit for Blood Pressure Check. Patient first reading was 157/88.  Pt wanted to add to chart that father has history of high blood pressure and they wanted him to go on bp medication when he was younger but never did. Waited 5 minutes for recheck of bp.  Second bp reading was 140/92. Please advise on patient headaches

## 2023-12-15 NOTE — Telephone Encounter (Signed)
Attempted to call pt about his headaches and to let him know recommendation per Jon Gills but unable to reach. Left message for pt to return call and have also sent him a mychart message about it too.

## 2023-12-27 ENCOUNTER — Ambulatory Visit (HOSPITAL_BASED_OUTPATIENT_CLINIC_OR_DEPARTMENT_OTHER): Payer: No Typology Code available for payment source | Admitting: Family Medicine

## 2023-12-27 ENCOUNTER — Encounter (HOSPITAL_BASED_OUTPATIENT_CLINIC_OR_DEPARTMENT_OTHER): Payer: Self-pay | Admitting: Family Medicine

## 2023-12-27 VITALS — BP 115/80 | HR 60 | Ht 72.0 in | Wt 248.0 lb

## 2023-12-27 DIAGNOSIS — Z23 Encounter for immunization: Secondary | ICD-10-CM | POA: Diagnosis not present

## 2023-12-27 DIAGNOSIS — Z13220 Encounter for screening for lipoid disorders: Secondary | ICD-10-CM | POA: Diagnosis not present

## 2023-12-27 DIAGNOSIS — E66811 Obesity, class 1: Secondary | ICD-10-CM | POA: Diagnosis not present

## 2023-12-27 DIAGNOSIS — I10 Essential (primary) hypertension: Secondary | ICD-10-CM | POA: Diagnosis not present

## 2023-12-27 DIAGNOSIS — Z0000 Encounter for general adult medical examination without abnormal findings: Secondary | ICD-10-CM

## 2023-12-27 MED ORDER — LOSARTAN POTASSIUM 25 MG PO TABS: 25.0000 mg | ORAL_TABLET | Freq: Every day | ORAL | 3 refills | Status: AC

## 2023-12-27 NOTE — Progress Notes (Signed)
Subjective:   Jacob Kidd 10/05/1996 12/27/2023  CC: Chief Complaint  Patient presents with   Annual Exam    Patient is here today for his physical. Denies any concerns for today's visit.   Medical Management of Chronic Issues    HPI: Jacob Kidd is a 28 y.o. male who presents for a routine health maintenance exam.  Labs collected at time of visit.   HYPERTENSION: DAISUKE BAILEY presents for the medical management of hypertension. Patient was having recurring headaches due to elevated blood pressure in the past month that were unrelieved by Ibuprofen and Imitrex. He reports family hx of HTN and previous hx of elevated BP in adolescence that was not treated by medication. Reports since starting Losartan for HTN he has not experienced any headaches.   Patient's current hypertension medication regimen is: Losartan 25mg  every Kidd  Patient is  currently taking prescribed medications for HTN.  Patient is  regularly keeping a check on BP at home. Reports systolic average is less than 409, diasolic average 81-19 Adhering to low sodium diet: Yes, trying to make dietary changes Exercising Regularly: Yes Denies headache, dizziness, CP, SHOB, vision changes.    BP Readings from Last 3 Encounters:  12/27/23 115/80  12/15/23 (!) 157/88  12/07/23 130/88     HEALTH SCREENINGS: - Vision Screening: up to date - Dental Visits:  Up to date - Testicular Exam: Declined - STD Screening: Declined - PSA (50+): Not applicable  No results found for: "PSA1", "PSA"   - Colonoscopy (45+): Not applicable  Discussed with patient purpose of the colonoscopy is to detect colon cancer at curable precancerous or early stages  - AAA Screening: Not applicable  Men age 55-75 who have ever smoked - Lung Cancer screening with low-dose CT: Not applicable-  Adults age 33-80 who are current cigarette smokers or quit within the last 15 years. Must have 20 pack year history.   Depression and Anxiety  Screen done today and results listed below:     12/27/2023    2:30 PM 12/07/2023    2:11 PM 07/30/2020    3:49 PM 03/06/2020    2:04 PM  Depression screen PHQ 2/9  Decreased Interest 1 1 3  0  Down, Depressed, Hopeless 1 1 3  0  PHQ - 2 Score 2 2 6  0  Altered sleeping 3 3 3  0  Tired, decreased energy 3 3 3  0  Change in appetite 3 3 3  0  Feeling bad or failure about yourself  0 0 3 0  Trouble concentrating 0 0 0 0  Moving slowly or fidgety/restless 0 0 0 0  Suicidal thoughts 0 0 0 0  PHQ-9 Score 11 11 18  0  Difficult doing work/chores Somewhat difficult Somewhat difficult Very difficult Not difficult at all      12/27/2023    2:33 PM 12/07/2023    2:13 PM 07/30/2020    3:50 PM  GAD 7 : Generalized Anxiety Score  Nervous, Anxious, on Edge 0 0 3  Control/stop worrying 0 0 3  Worry too much - different things 1 1 3   Trouble relaxing 0 0 3  Restless 3 3 0  Easily annoyed or irritable 1 1 3   Afraid - awful might happen 0 0 0  Total GAD 7 Score 5 5 15   Anxiety Difficulty Not difficult at all Not difficult at all Very difficult    IMMUNIZATIONS:  - Tdap: Tetanus vaccination status reviewed: Last Tdap in 2006; 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 - Influenza: Refused - Pneumovax: Not applicable - Prevnar: Not applicable - Shingrix vaccine (50+): Not applicable   Past medical history, surgical history, medications, allergies, family history and social history reviewed with patient today and changes made to appropriate areas of the chart.   Past Medical History:  Diagnosis Date   Depression    Migraines     History reviewed. No pertinent surgical history.  Current Outpatient Medications on File Prior to Visit  Medication Sig   ibuprofen (ADVIL) 200 MG tablet Take 800 mg by mouth as needed for headache.   No current facility-administered medications on file prior to visit.    No Known Allergies   Social History   Socioeconomic History   Marital status: Married     Spouse name: Not on file   Number of children: Not on file   Years of education: Not on file   Highest education level: Not on file  Occupational History   Not on file  Tobacco Use   Smoking status: Never   Smokeless tobacco: Current  Vaping Use   Vaping status: Never Used  Substance and Sexual Activity   Alcohol use: No   Drug use: No   Sexual activity: Yes  Other Topics Concern   Not on file  Social History Narrative   Not on file   Social Drivers of Health   Financial Resource Strain: Low Risk  (12/07/2023)   Overall Financial Resource Strain (CARDIA)    Difficulty of Paying Living Expenses: Not hard at all  Food Insecurity: No Food Insecurity (12/07/2023)   Hunger Vital Sign    Worried About Running Out of Food in the Last Year: Never true    Ran Out of Food in the Last Year: Never true  Transportation Needs: No Transportation Needs (12/07/2023)   PRAPARE - Administrator, Civil Service (Medical): No    Lack of Transportation (Non-Medical): No  Physical Activity: Inactive (12/07/2023)   Exercise Vital Sign    Days of Exercise per Week: 0 days    Minutes of Exercise per Session: 0 min  Stress: Stress Concern Present (12/07/2023)   [employer] of Occupational Health - Occupational Stress Questionnaire    Feeling of Stress : To some extent  Social Connections: Moderately Isolated (12/07/2023)   Social Connection and Isolation Panel [NHANES]    Frequency of Communication with Friends and Family: More than three times a week    Frequency of Social Gatherings with Friends and Family: Never    Attends Religious Services: Never    Database administrator or Organizations: No    Attends Banker Meetings: Never    Marital Status: Married  Catering manager Violence: Not At Risk (12/07/2023)   Humiliation, Afraid, Rape, and Kick questionnaire    Fear of Current or Ex-Partner: No    Emotionally Abused: No    Physically Abused: No    Sexually Abused: No    Social History   Tobacco Use  Smoking Status Never  Smokeless Tobacco Current   Social History   Substance and Sexual Activity  Alcohol Use No     Family History  Problem Relation Age of Onset   Migraines Mother    Hypertension Father    Diabetes Paternal Grandmother    Hypertension Paternal Grandfather    Diabetes Paternal Grandfather    Glaucoma Paternal Grandfather      ROS: Denies fever, fatigue, unexplained weight loss/gain, CP, SHOB,  and palpatitations. Denies neurological deficits, gastrointestinal and/or genitourinary complaints, and skin changes.   Objective:   Today's Vitals   12/27/23 1427  BP: 115/80  Pulse: 60  SpO2: 100%  Weight: 248 lb (112.5 kg)  Height: 6' (1.829 m)    GENERAL APPEARANCE: Well-appearing, in NAD. Well nourished.  SKIN: Pink, warm and dry. Turgor normal. No rash, lesion, ulceration, or ecchymoses. Hair evenly distributed.  HEENT: HEAD: Normocephalic.  EYES: PERRLA. EOMI. Lids intact w/o defect. Sclera white, Conjunctiva pink w/o exudate.  EARS: External ear w/o redness, swelling, masses or lesions. EAC clear. TM's intact, translucent w/o bulging, appropriate landmarks visualized. Appropriate acuity to conversational tones.  NOSE: Septum midline w/o deformity. Nares patent, mucosa pink and non-inflamed w/o drainage. No sinus tenderness.  THROAT: Uvula midline. Oropharynx clear. Tonsils non-inflamed w/o exudate. Oral mucosa pink and moist.  NECK: Supple, Trachea midline. Full ROM w/o pain or tenderness. No lymphadenopathy. Thyroid non-tender w/o enlargement or palpable masses.  RESPIRATORY: Chest wall symmetrical w/o masses. Respirations even and non-labored. Breath sounds clear to auscultation bilaterally. No wheezes, rales, rhonchi, or crackles. CARDIAC: S1, S2 present, regular rate and rhythm. No gallops, murmurs, rubs, or clicks. PMI w/o lifts, heaves, or thrills. No carotid bruits. Capillary refill <2 seconds. Peripheral pulses 2+  bilaterally. GI: Abdomen soft w/o distention. Normoactive bowel sounds. No palpable masses or tenderness. No guarding or rebound tenderness. Liver and spleen w/o tenderness or enlargement. No CVA tenderness.  GU: Pt deferred exam. MSK: Muscle tone and strength appropriate for age, w/o atrophy or abnormal movement. EXTREMITIES: Active ROM intact, w/o tenderness, crepitus, or contracture. No obvious joint deformities or effusions. No clubbing, edema, or cyanosis.  NEUROLOGIC: CN's II-XII intact. Motor strength symmetrical with no obvious weakness. No sensory deficits. DTR 2+ symmetric bilaterally. Steady, even gait.  PSYCH/MENTAL STATUS: Alert, oriented x 3. Cooperative, appropriate mood and affect.   EKG: Sinus bradycardia with sinus arrhythmia. Otherwise normal ECG. There are no previous tracings available for comparison.   Assessment & Plan:  1. Annual physical exam (Primary) Discussed preventative screenings, vaccinations and healthy lifestyle with patient. Will obtain labs as part of AE today. Due to patient's fair complexion and red hair, recommend establishing care with Dermatology for annual visit of skin check.   2. Benign essential HTN Controlled. Headaches have resolved. Continue Losartan 25mg  daily. EKG completed due to new onset HTN and is unremarkable. Will obtain CMP to evaluate renal function and electrolytes with medication use.   - losartan (COZAAR) 25 MG tablet; Take 1 tablet (25 mg total) by mouth daily.  Dispense: 90 tablet; Refill: 3 - Comprehensive metabolic panel  3. Immunization due - Tdap vaccine greater than or equal to 7yo IM  4. Screening for lipid disorders Will obtain lab work today as part of AE to evaluate for lipid disoder given obesity and comorbidity of HTN.  - Lipid panel  5. Obesity (BMI 30.0-34.9) Will obtain A1C given obesity w/ BMI to rule out prediabetes or T2DM comorbidity.  - Hemoglobin A1c    Orders Placed This Encounter  Procedures    Tdap vaccine greater than or equal to 7yo IM   Comprehensive metabolic panel   Lipid panel   Hemoglobin A1c   EKG 12-Lead    Ordered by an unspecified provider     PATIENT COUNSELING: - Encouraged to adjust caloric intake to maintain or achieve ideal body weight, to reduce intake of dietary saturated fat and total fat, to limit sodium intake by avoiding high sodium foods  and not adding table salt, and to maintain adequate dietary potassium and calcium preferably from fresh fruits, vegetables, and low-fat dairy products.   - Advised to avoid cigarette smoking. - Discussed with the patient that most people either abstain from alcohol or drink within safe limits (<=14/week and <=4 drinks/occasion for males, <=7/weeks and <= 3 drinks/occasion for females) and that the risk for alcohol disorders and other health effects rises proportionally with the number of drinks per week and how often a drinker exceeds daily limits. - Discussed cessation/primary prevention of drug use and availability of treatment for abuse.   - Stressed the importance of regular exercise - Injury prevention: Discussed safety belts, safety helmets, smoke detector, smoking near bedding or upholstery.  - Dental health: Discussed importance of regular tooth brushing, flossing, and dental visits.  - Sexuality: Discussed sexually transmitted diseases, partner selection, use of condoms, avoidance of unintended pregnancy  and contraceptive alternatives.   NEXT PREVENTATIVE PHYSICAL DUE IN 1 YEAR.  Return in about 6 months (around 06/25/2024) for HYPERTENSION.  Patient to reach out to office if new, worrisome, or unresolved symptoms arise or if no improvement in patient's condition. Patient verbalized understanding and is agreeable to treatment plan. All questions answered to patient's satisfaction.    Hilbert Bible, [place]

## 2023-12-27 NOTE — Patient Instructions (Signed)

## 2023-12-28 ENCOUNTER — Encounter (HOSPITAL_BASED_OUTPATIENT_CLINIC_OR_DEPARTMENT_OTHER): Payer: Self-pay | Admitting: Family Medicine

## 2023-12-28 LAB — LIPID PANEL
Chol/HDL Ratio: 5.2 {ratio} — ABNORMAL HIGH (ref 0.0–5.0)
Cholesterol, Total: 162 mg/dL (ref 100–199)
HDL: 31 mg/dL — ABNORMAL LOW (ref >39)
LDL Chol Calc (NIH): 111 mg/dL — ABNORMAL HIGH (ref 0–99)
Triglycerides: 106 mg/dL (ref 0–149)
VLDL Cholesterol Cal: 20 mg/dL (ref 5–40)

## 2023-12-28 LAB — COMPREHENSIVE METABOLIC PANEL
ALT: 22 [IU]/L (ref 0–44)
AST: 21 [IU]/L (ref 0–40)
Albumin: 4.7 g/dL (ref 4.3–5.2)
Alkaline Phosphatase: 73 [IU]/L (ref 44–121)
BUN/Creatinine Ratio: 16 (ref 9–20)
BUN: 15 mg/dL (ref 6–20)
Bilirubin Total: 0.4 mg/dL (ref 0.0–1.2)
CO2: 24 mmol/L (ref 20–29)
Calcium: 9.6 mg/dL (ref 8.7–10.2)
Chloride: 103 mmol/L (ref 96–106)
Creatinine, Ser: 0.93 mg/dL (ref 0.76–1.27)
Globulin, Total: 2.5 g/dL (ref 1.5–4.5)
Glucose: 80 mg/dL (ref 70–99)
Potassium: 4.3 mmol/L (ref 3.5–5.2)
Sodium: 142 mmol/L (ref 134–144)
Total Protein: 7.2 g/dL (ref 6.0–8.5)
eGFR: 114 mL/min/{1.73_m2} (ref >59)

## 2023-12-28 LAB — HEMOGLOBIN A1C
Est. average glucose Bld gHb Est-mCnc: 108 mg/dL
Hgb A1c MFr Bld: 5.4 % (ref 4.8–5.6)

## 2023-12-28 NOTE — Progress Notes (Signed)
Hi Jacob Kidd, Your electrolytes, kidney and liver function are normal.  Please continue on the blood pressure medicine as discussed.  Your triglycerides have improved as part of your cholesterol panel from 3 years ago.  Your LDL or bad cholesterol was slightly elevated, but your total cholesterol is normal.  Please continue on dietary changes, introducing Mediterranean-based foods such as nuts, beans, olive oils, lean meats such as [place], tuna, fish and we will repeat in 1 year.

## 2024-04-22 ENCOUNTER — Other Ambulatory Visit (HOSPITAL_BASED_OUTPATIENT_CLINIC_OR_DEPARTMENT_OTHER): Payer: Self-pay | Admitting: Family Medicine
